# Patient Record
Sex: Male | Born: 1990 | Race: White | Hispanic: No | Marital: Married | Smoking: Never smoker
Health system: Southern US, Community
[De-identification: ages and names within clinical notes are randomized; demographics above are authoritative.]

## PROBLEM LIST (undated history)

## (undated) DIAGNOSIS — Z952 Presence of prosthetic heart valve: Secondary | ICD-10-CM

## (undated) DIAGNOSIS — I639 Cerebral infarction, unspecified: Secondary | ICD-10-CM

## (undated) HISTORY — PX: CARDIAC SURGERY: SHX584

---

## 2012-08-10 DIAGNOSIS — I639 Cerebral infarction, unspecified: Secondary | ICD-10-CM

## 2012-08-10 HISTORY — DX: Cerebral infarction, unspecified: I63.9

## 2017-07-27 ENCOUNTER — Encounter: Payer: Self-pay | Admitting: *Deleted

## 2017-07-27 ENCOUNTER — Emergency Department
Admission: EM | Admit: 2017-07-27 | Discharge: 2017-07-27 | Disposition: A | Discharge status: Home / Self Care | Payer: Managed Care, Other (non HMO) | Attending: Emergency Medicine | Admitting: Emergency Medicine

## 2017-07-27 ENCOUNTER — Other Ambulatory Visit: Payer: Self-pay

## 2017-07-27 ENCOUNTER — Emergency Department: Payer: Managed Care, Other (non HMO)

## 2017-07-27 DIAGNOSIS — M25562 Pain in left knee: Secondary | ICD-10-CM | POA: Insufficient documentation

## 2017-07-27 DIAGNOSIS — Z8673 Personal history of transient ischemic attack (TIA), and cerebral infarction without residual deficits: Secondary | ICD-10-CM | POA: Diagnosis not present

## 2017-07-27 DIAGNOSIS — Z79899 Other long term (current) drug therapy: Secondary | ICD-10-CM | POA: Insufficient documentation

## 2017-07-27 DIAGNOSIS — Y999 Unspecified external cause status: Secondary | ICD-10-CM | POA: Diagnosis not present

## 2017-07-27 DIAGNOSIS — Y9389 Activity, other specified: Secondary | ICD-10-CM | POA: Diagnosis not present

## 2017-07-27 DIAGNOSIS — M79602 Pain in left arm: Secondary | ICD-10-CM | POA: Insufficient documentation

## 2017-07-27 DIAGNOSIS — R51 Headache: Secondary | ICD-10-CM | POA: Diagnosis present

## 2017-07-27 HISTORY — DX: Cerebral infarction, unspecified: I63.9

## 2017-07-27 HISTORY — DX: Presence of prosthetic heart valve: Z95.2

## 2017-07-27 MED ORDER — CYCLOBENZAPRINE HCL 10 MG PO TABS: 10.0000 mg | ORAL_TABLET | Freq: Three times a day (TID) | ORAL | 0 refills | Status: DC | PRN
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
## 2017-07-27 NOTE — Discharge Instructions (Signed)
Advised extra strength Tylenol for headache or body ache.

## 2019-01-02 ENCOUNTER — Emergency Department
Admission: EM | Admit: 2019-01-02 | Discharge: 2019-01-02 | Disposition: A | Discharge status: Home / Self Care | Payer: Managed Care, Other (non HMO) | Attending: Emergency Medicine | Admitting: Emergency Medicine

## 2019-01-02 ENCOUNTER — Encounter: Payer: Self-pay | Admitting: Emergency Medicine

## 2019-01-02 ENCOUNTER — Other Ambulatory Visit: Payer: Self-pay

## 2019-01-02 DIAGNOSIS — Z7982 Long term (current) use of aspirin: Secondary | ICD-10-CM | POA: Insufficient documentation

## 2019-01-02 DIAGNOSIS — Z7901 Long term (current) use of anticoagulants: Secondary | ICD-10-CM | POA: Insufficient documentation

## 2019-01-02 DIAGNOSIS — Z79899 Other long term (current) drug therapy: Secondary | ICD-10-CM | POA: Diagnosis not present

## 2019-01-02 DIAGNOSIS — R319 Hematuria, unspecified: Secondary | ICD-10-CM | POA: Diagnosis not present

## 2019-01-02 LAB — CBC WITH DIFFERENTIAL/PLATELET
Abs Immature Granulocytes: 0.02 10*3/uL (ref 0.00–0.07)
Basophils Absolute: 0.1 10*3/uL (ref 0.0–0.1)
Basophils Relative: 1 %
Eosinophils Absolute: 0.4 10*3/uL (ref 0.0–0.5)
Eosinophils Relative: 6 %
HCT: 43.8 % (ref 39.0–52.0)
Hemoglobin: 14.9 g/dL (ref 13.0–17.0)
Immature Granulocytes: 0 %
Lymphocytes Relative: 20 %
Lymphs Abs: 1.5 10*3/uL (ref 0.7–4.0)
MCH: 30 pg (ref 26.0–34.0)
MCHC: 34 g/dL (ref 30.0–36.0)
MCV: 88.1 fL (ref 80.0–100.0)
Monocytes Absolute: 0.5 10*3/uL (ref 0.1–1.0)
Monocytes Relative: 6 %
Neutro Abs: 5 10*3/uL (ref 1.7–7.7)
Neutrophils Relative %: 67 %
Platelets: 166 10*3/uL (ref 150–400)
RBC: 4.97 MIL/uL (ref 4.22–5.81)
RDW: 12.3 % (ref 11.5–15.5)
WBC: 7.5 10*3/uL (ref 4.0–10.5)
nRBC: 0 % (ref 0.0–0.2)

## 2019-01-02 LAB — URINALYSIS, COMPLETE (UACMP) WITH MICROSCOPIC
Bilirubin Urine: NEGATIVE
Glucose, UA: NEGATIVE mg/dL
Ketones, ur: NEGATIVE mg/dL
Leukocytes,Ua: NEGATIVE
Nitrite: NEGATIVE
Protein, ur: NEGATIVE mg/dL
Specific Gravity, Urine: 1.003 — ABNORMAL LOW (ref 1.005–1.030)
Squamous Epithelial / LPF: NONE SEEN (ref 0–5)
pH: 7 (ref 5.0–8.0)

## 2019-01-02 LAB — BASIC METABOLIC PANEL
Anion gap: 8 (ref 5–15)
BUN: 14 mg/dL (ref 6–20)
CO2: 26 mmol/L (ref 22–32)
Calcium: 9.1 mg/dL (ref 8.9–10.3)
Chloride: 106 mmol/L (ref 98–111)
Creatinine, Ser: 0.7 mg/dL (ref 0.61–1.24)
GFR calc Af Amer: >60 mL/min (ref >60)
GFR calc non Af Amer: >60 mL/min (ref >60)
Glucose, Bld: 106 mg/dL — ABNORMAL HIGH (ref 70–99)
Potassium: 4.3 mmol/L (ref 3.5–5.1)
Sodium: 140 mmol/L (ref 135–145)

## 2019-01-02 LAB — PROTIME-INR
INR: 2.2 — ABNORMAL HIGH (ref 0.8–1.2)
Prothrombin Time: 24 seconds — ABNORMAL HIGH (ref 11.4–15.2)

## 2019-01-02 NOTE — ED Provider Notes (Signed)
[facility] Emergency Department Provider Note ____________________________________________   First MD Initiated Contact with Patient 01/02/19 [phone]     (approximate)  I have reviewed the triage vital signs and the nursing notes.   HISTORY  Chief Complaint Hematuria    HPI Jeff Graham is a 28 y.o. male with PMH as noted below who presents with hematuria, acute onset yesterday, initially more opaque dark blood in the urine and now just pink-tinged urine.  He denies fever, pain, vomiting, or any weakness or lightheadedness.  He has had some bruising which is not unusual for him, but denies any other abnormal bleeding.  He is on Coumadin and states that he has been on the same dose for a while.  He last took it yesterday morning.  Past Medical History:  Diagnosis Date  . Mitral valve replaced   . Stroke [facility]) 2014    There are no active problems to display for this patient.   History reviewed. No pertinent surgical history.  Prior to Admission medications   Medication Sig Start Date End Date Taking? Authorizing Provider  aspirin 81 MG EC tablet Take 81 mg by mouth daily.    Yes [provider]  FLUoxetine (PROZAC) 40 MG capsule Take 40 mg by mouth daily.   Yes [provider]  gabapentin (NEURONTIN) 600 MG tablet Take 600 mg by mouth 3 (three) times daily.   Yes [provider]  metoprolol tartrate (LOPRESSOR) 25 MG tablet Take 25 mg by mouth 2 (two) times a day.   Yes [provider]  warfarin (COUMADIN) 10 MG tablet Take 10 mg by mouth every Monday, Wednesday, and Friday.    Yes [provider]  warfarin (COUMADIN) 5 MG tablet Take 5 mg by mouth See admin instructions. Take 1 tablet (5mg ) by mouth every Sunday, Tuesday, Thursday and Saturday   Yes [provider]    Allergies Patient has no known allergies.  No family history on file.  Social History Social History   Tobacco Use  . Smoking  status: Never Smoker  . Smokeless tobacco: Never Used  Substance Use Topics  . Alcohol use: No    Frequency: Never  . Drug use: Not on file    Review of Systems  Constitutional: No fever. Eyes: No redness. ENT: No sore throat. Cardiovascular: Denies chest pain. Respiratory: Denies shortness of breath. Gastrointestinal: No vomiting or diarrhea.  Genitourinary: Negative for dysuria.  Positive for hematuria. Musculoskeletal: Negative for back pain. Skin: Negative for rash. Neurological: Negative for headache.   ____________________________________________   PHYSICAL EXAM:  VITAL SIGNS: ED Triage Vitals  Enc Vitals Group     BP 01/02/19 0909 116/70     Pulse Rate 01/02/19 0909 72     Resp 01/02/19 0909 16     Temp 01/02/19 0909 98.1 F (36.7 C)     Temp Source 01/02/19 0909 Oral     SpO2 01/02/19 0909 97 %     Weight 01/02/19 0904 175 lb (79.4 kg)     Height 01/02/19 0904 6\' 2"  (1.88 m)     Head Circumference --      Peak Flow --      Pain Score 01/02/19 0904 0     Pain Loc --      Pain Edu? --      Excl. in GC? --     Constitutional: Alert and oriented. Well appearing and in no acute distress. Eyes: Conjunctivae are normal.  No pallor.  Head: Atraumatic. Nose: No congestion/rhinnorhea. Mouth/Throat: Mucous membranes are moist.   Neck: Normal range of motion.  Cardiovascular: Good peripheral circulation. Respiratory: Normal respiratory effort.  Gastrointestinal:  No distention.  Musculoskeletal: No lower extremity edema. Neurologic:  Normal speech and language. No gross focal neurologic deficits are appreciated.  Skin:  Skin is warm and dry. No rash noted. Psychiatric: Mood and affect are normal. Speech and behavior are normal.  ____________________________________________   LABS (all labs ordered are listed, but only abnormal results are displayed)  Labs Reviewed  BASIC METABOLIC PANEL - Abnormal; Notable for the following components:      Result Value    Glucose, Bld 106 (*)    All other components within normal limits  PROTIME-INR - Abnormal; Notable for the following components:   Prothrombin Time 24.0 (*)    INR 2.2 (*)    All other components within normal limits  URINALYSIS, COMPLETE (UACMP) WITH MICROSCOPIC - Abnormal; Notable for the following components:   Color, Urine YELLOW (*)    APPearance CLEAR (*)    Specific Gravity, Urine 1.003 (*)    Hgb urine dipstick LARGE (*)    Bacteria, UA FEW (*)    All other components within normal limits  CBC WITH DIFFERENTIAL/PLATELET   ____________________________________________  EKG   ____________________________________________  RADIOLOGY    ____________________________________________   PROCEDURES  Procedure(s) performed: No  Procedures  Critical Care performed: No ____________________________________________   INITIAL IMPRESSION / ASSESSMENT AND PLAN / ED COURSE  Pertinent labs & imaging results that were available during my care of the patient were reviewed by me and considered in my medical decision making (see chart for details).  28 year old male with PMH as noted above who is on Coumadin after a heart valve replacement presents with hematuria, initially more opaque blood with clots and now the urine is clear but pink in color.  He denies any pain, lightheadedness or weakness, or other abnormal bleeding.  On exam, the patient is well-appearing and his vital signs are normal.  The remainder of the exam is unremarkable.  The main concern is supratherapeutic INR.  The patient has no symptoms to suggest UTI/cystitis, ureteral stone, or other primary urologic etiology of the blood.  We will check basic labs, PT/INR, urinalysis, and reassess.  ----------------------------------------- 10:45 AM on 01/02/2019 -----------------------------------------  Lab work-up is unremarkable.  The patient's INR is actually slightly subtherapeutic.  UA shows RBCs but no other  abnormalities.  The patient has urinated twice while in the ED and it is now clear.  I suspect that he could have had a small kidney stone or some other mild bleeding from the lining of the bladder which has self resolved.  I recommended that he take his dose of Coumadin for today, and continue it as prescribed.  I instructed him to follow-up with his primary care doctor.  If the bleeding becomes recurrent he will need outpatient urology evaluation.  He is stable for discharge home at this time.  I gave the patient the return precautions, and he expressed understanding. ____________________________________________   FINAL CLINICAL IMPRESSION(S) / ED DIAGNOSES  Final diagnoses:  Hematuria, unspecified type      NEW MEDICATIONS STARTED DURING THIS VISIT:  New Prescriptions   No medications on file     Note:  This document was prepared using Dragon voice recognition software and may include unintentional dictation errors.    Dionne BucySiadecki, Chelse Matas, MD 01/02/19 1046

## 2019-01-02 NOTE — Discharge Instructions (Addendum)
Continue to take your Coumadin as prescribed including today's dose.  Return to the ER for new, worsening, or persistent blood in the urine or other abnormal bleeding.  Follow-up with your primary care doctor.

## 2019-01-02 NOTE — ED Triage Notes (Signed)
Pt states has blood in his urine starting last pm. Pt on blood thinners due to a mechanical heart valve. Denies all other sx's.
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
## 2020-01-01 ENCOUNTER — Other Ambulatory Visit: Payer: Self-pay | Admitting: Physician Assistant

## 2020-01-01 DIAGNOSIS — IMO0001 Reserved for inherently not codable concepts without codable children: Secondary | ICD-10-CM

## 2020-01-16 ENCOUNTER — Ambulatory Visit
Admission: RE | Admit: 2020-01-16 | Discharge: 2020-01-16 | Disposition: A | Discharge status: Home / Self Care | Payer: Managed Care, Other (non HMO) | Source: Ambulatory Visit | Attending: Physician Assistant | Admitting: Physician Assistant

## 2020-01-16 ENCOUNTER — Other Ambulatory Visit: Payer: Self-pay

## 2020-01-16 DIAGNOSIS — H9041 Sensorineural hearing loss, unilateral, right ear, with unrestricted hearing on the contralateral side: Secondary | ICD-10-CM | POA: Diagnosis not present

## 2020-01-16 DIAGNOSIS — IMO0001 Reserved for inherently not codable concepts without codable children: Secondary | ICD-10-CM

## 2020-01-16 MED ORDER — GADOBUTROL 1 MMOL/ML IV SOLN
7.0000 mL | Freq: Once | INTRAVENOUS | Status: AC | PRN
  Administered 2020-01-16: 7 mL via INTRAVENOUS

## 2021-04-10 ENCOUNTER — Other Ambulatory Visit: Payer: Self-pay | Admitting: Otolaryngology

## 2021-04-10 DIAGNOSIS — R221 Localized swelling, mass and lump, neck: Secondary | ICD-10-CM

## 2021-04-29 ENCOUNTER — Other Ambulatory Visit: Payer: Self-pay

## 2021-04-29 ENCOUNTER — Ambulatory Visit
Admission: RE | Admit: 2021-04-29 | Discharge: 2021-04-29 | Disposition: A | Discharge status: Home / Self Care | Payer: Managed Care, Other (non HMO) | Source: Ambulatory Visit | Attending: Otolaryngology | Admitting: Otolaryngology

## 2021-04-29 DIAGNOSIS — R221 Localized swelling, mass and lump, neck: Secondary | ICD-10-CM | POA: Diagnosis present

## 2021-04-29 MED ORDER — IOHEXOL 350 MG/ML SOLN
75.0000 mL | Freq: Once | INTRAVENOUS | Status: AC | PRN
  Administered 2021-04-29: 75 mL via INTRAVENOUS

## 2022-07-15 ENCOUNTER — Telehealth: Payer: Managed Care, Other (non HMO) | Admitting: Physician Assistant

## 2022-07-15 DIAGNOSIS — J208 Acute bronchitis due to other specified organisms: Secondary | ICD-10-CM | POA: Diagnosis not present

## 2022-07-15 MED ORDER — PREDNISONE 20 MG PO TABS: 40.0000 mg | ORAL_TABLET | Freq: Every day | ORAL | 0 refills | Status: DC

## 2022-07-15 MED ORDER — ALBUTEROL SULFATE HFA 108 (90 BASE) MCG/ACT IN AERS: 1.0000 | INHALATION_SPRAY | Freq: Four times a day (QID) | RESPIRATORY_TRACT | 0 refills | Status: DC | PRN

## 2022-07-15 MED ORDER — PROMETHAZINE-DM 6.25-15 MG/5ML PO SYRP: 5.0000 mL | ORAL_SOLUTION | Freq: Four times a day (QID) | ORAL | 0 refills | Status: AC | PRN

## 2022-07-15 NOTE — Progress Notes (Signed)
Virtual Visit Consent   Jeff Graham, you are scheduled for a virtual visit with a [facility] provider today. Just as with appointments in the office, your consent must be obtained to participate. Your consent will be active for this visit and any virtual visit you may have with one of our providers in the next 365 days. If you have a MyChart account, a copy of this consent can be sent to you electronically.  As this is a virtual visit, video technology does not allow for your provider to perform a traditional examination. This may limit your provider's ability to fully assess your condition. If your provider identifies any concerns that need to be evaluated in person or the need to arrange testing (such as labs, EKG, etc.), we will make arrangements to do so. Although advances in technology are sophisticated, we cannot ensure that it will always work on either your end or our end. If the connection with a video visit is poor, the visit may have to be switched to a telephone visit. With either a video or telephone visit, we are not always able to ensure that we have a secure connection.  By engaging in this virtual visit, you consent to the provision of healthcare and authorize for your insurance to be billed (if applicable) for the services provided during this visit. Depending on your insurance coverage, you may receive a charge related to this service.  I need to obtain your verbal consent now. Are you willing to proceed with your visit today? Jeff Graham has provided verbal consent on 07/15/2022 for a virtual visit (video or telephone). Jeff Loveless, PA-C  Date: 07/15/2022 9:24 AM  Virtual Visit via Video Note   I, Jeff Graham, connected with  Jeff Graham  ([id number], 03-27-1991) on 07/15/22 at  9:15 AM EST by a video-enabled telemedicine application and verified that I am speaking with the correct person using two identifiers.  Location: Patient: Virtual Visit Location  Patient: Home Provider: Virtual Visit Location Provider: Home Office   I discussed the limitations of evaluation and management by telemedicine and the availability of in person appointments. The patient expressed understanding and agreed to proceed.    History of Present Illness: Jeff Graham is a 31 y.o. who identifies as a male who was assigned male at birth, and is being seen today for Cough.  HPI: Cough This is a new problem. The current episode started in the past 7 days (3-4 days ago). The problem has been gradually worsening. The problem occurs every few minutes. The cough is Productive of sputum and productive of purulent sputum. Associated symptoms include chest pain (burning), chills (on 1st occurence), nasal congestion, a sore throat, shortness of breath (mild DOE) and wheezing. Pertinent negatives include no fever, headaches, myalgias, postnasal drip or rhinorrhea. Associated symptoms comments: Activities increase SOB and coughing. The symptoms are aggravated by lying down and exercise. Treatments tried: tried an inhaler from a friend, tums. The treatment provided mild relief. There is no history of asthma, bronchitis or pneumonia.    Problems: There are no problems to display for this patient.   Allergies: No Known Allergies Medications:  Current Outpatient Medications:    albuterol (VENTOLIN HFA) 108 (90 Base) MCG/ACT inhaler, Inhale 1-2 puffs into the lungs every 6 (six) hours as needed., Disp: 8 g, Rfl: 0   predniSONE (DELTASONE) 20 MG tablet, Take 2 tablets (40 mg total) by mouth daily with breakfast., Disp: 10 tablet, Rfl: 0   promethazine-dextromethorphan (PROMETHAZINE-DM)  6.25-15 MG/5ML syrup, Take 5 mLs by mouth 4 (four) times daily as needed., Disp: 118 mL, Rfl: 0   aspirin 81 MG EC tablet, Take 81 mg by mouth daily. , Disp: , Rfl:    FLUoxetine (PROZAC) 40 MG capsule, Take 40 mg by mouth daily., Disp: , Rfl:    gabapentin (NEURONTIN) 600 MG tablet, Take 600 mg by mouth 3  (three) times daily., Disp: , Rfl:    metoprolol tartrate (LOPRESSOR) 25 MG tablet, Take 25 mg by mouth 2 (two) times a day., Disp: , Rfl:    warfarin (COUMADIN) 10 MG tablet, Take 10 mg by mouth every Monday, Wednesday, and Friday. , Disp: , Rfl:    warfarin (COUMADIN) 5 MG tablet, Take 5 mg by mouth See admin instructions. Take 1 tablet (5mg ) by mouth every Sunday, Tuesday, Thursday and Saturday, Disp: , Rfl:   Observations/Objective: Patient is well-developed, well-nourished in no acute distress.  Resting comfortably at home.  Head is normocephalic, atraumatic.  No labored breathing.  Speech is clear and coherent with logical content.  Patient is alert and oriented at baseline.    Assessment and Plan: 1. Viral bronchitis - predniSONE (DELTASONE) 20 MG tablet; Take 2 tablets (40 mg total) by mouth daily with breakfast.  Dispense: 10 tablet; Refill: 0 - albuterol (VENTOLIN HFA) 108 (90 Base) MCG/ACT inhaler; Inhale 1-2 puffs into the lungs every 6 (six) hours as needed.  Dispense: 8 g; Refill: 0 - promethazine-dextromethorphan (PROMETHAZINE-DM) 6.25-15 MG/5ML syrup; Take 5 mLs by mouth 4 (four) times daily as needed.  Dispense: 118 mL; Refill: 0  - Suspect viral bronchitis - Prednisone, albuterol and Promethazine DM prescribed - Symptomatic medications of choice over the counter as needed - Push fluids - Rest - Seek further evaluation if symptoms change or worsen   Follow Up Instructions: I discussed the assessment and treatment plan with the patient. The patient was provided an opportunity to ask questions and all were answered. The patient agreed with the plan and demonstrated an understanding of the instructions.  A copy of instructions were sent to the patient via MyChart unless otherwise noted below.    The patient was advised to call back or seek an in-person evaluation if the symptoms worsen or if the condition fails to improve as anticipated.  Time:  I spent 12 minutes  with the patient via telehealth technology discussing the above problems/concerns.    10-[birth year]-1990, PA-C

## 2022-07-15 NOTE — Patient Instructions (Signed)
Estill Bakes, thank you for joining Margaretann Loveless, PA-C for today's virtual visit.  While this provider is not your primary care provider (PCP), if your PCP is located in our provider database this encounter information will be shared with them immediately following your visit.   A [facility] MyChart account gives you access to today's visit and all your visits, tests, and labs performed at [facility] " click here if you don't have a [facility] MyChart account or go to [email or web address]  Consent: (Patient) Jeff Graham provided verbal consent for this virtual visit at the beginning of the encounter.  Current Medications:  Current Outpatient Medications:    albuterol (VENTOLIN HFA) 108 (90 Base) MCG/ACT inhaler, Inhale 1-2 puffs into the lungs every 6 (six) hours as needed., Disp: 8 g, Rfl: 0   predniSONE (DELTASONE) 20 MG tablet, Take 2 tablets (40 mg total) by mouth daily with breakfast., Disp: 10 tablet, Rfl: 0   promethazine-dextromethorphan (PROMETHAZINE-DM) 6.25-15 MG/5ML syrup, Take 5 mLs by mouth 4 (four) times daily as needed., Disp: 118 mL, Rfl: 0   aspirin 81 MG EC tablet, Take 81 mg by mouth daily. , Disp: , Rfl:    FLUoxetine (PROZAC) 40 MG capsule, Take 40 mg by mouth daily., Disp: , Rfl:    gabapentin (NEURONTIN) 600 MG tablet, Take 600 mg by mouth 3 (three) times daily., Disp: , Rfl:    metoprolol tartrate (LOPRESSOR) 25 MG tablet, Take 25 mg by mouth 2 (two) times a day., Disp: , Rfl:    warfarin (COUMADIN) 10 MG tablet, Take 10 mg by mouth every Monday, Wednesday, and Friday. , Disp: , Rfl:    warfarin (COUMADIN) 5 MG tablet, Take 5 mg by mouth See admin instructions. Take 1 tablet (5mg ) by mouth every Sunday, Tuesday, Thursday and Saturday, Disp: , Rfl:    Medications ordered in this encounter:  Meds ordered this encounter  Medications   predniSONE (DELTASONE) 20 MG tablet    Sig: Take 2 tablets (40 mg total) by mouth daily with breakfast.     Dispense:  10 tablet    Refill:  0    Order Specific Question:   Supervising Provider    Answer:   Wednesday Merrilee Jansky   albuterol (VENTOLIN HFA) 108 (90 Base) MCG/ACT inhaler    Sig: Inhale 1-2 puffs into the lungs every 6 (six) hours as needed.    Dispense:  8 g    Refill:  0    Order Specific Question:   Supervising Provider    Answer:   [[id number]] Merrilee Jansky   promethazine-dextromethorphan (PROMETHAZINE-DM) 6.25-15 MG/5ML syrup    Sig: Take 5 mLs by mouth 4 (four) times daily as needed.    Dispense:  118 mL    Refill:  0    Order Specific Question:   Supervising Provider    Answer:   10-12-1991 Merrilee Jansky     *If you need refills on other medications prior to your next appointment, please contact your pharmacy*  Follow-Up: Call back or seek an in-person evaluation if the symptoms worsen or if the condition fails to improve as anticipated.  [facility] [phone]  Other Instructions  Acute Bronchitis, Adult  Acute bronchitis is sudden inflammation of the main airways (bronchi) that come off the windpipe (trachea) in the lungs. The swelling causes the airways to get smaller and make more mucus than normal. This can make it hard to breathe and can cause coughing  or noisy breathing (wheezing). Acute bronchitis may last several weeks. The cough may last longer. Allergies, asthma, and exposure to smoke may make the condition worse. What are the causes? This condition can be caused by germs and by substances that irritate the lungs, including: Cold and flu viruses. The most common cause of this condition is the virus that causes the common cold. Bacteria. This is less common. Breathing in substances that irritate the lungs, including: Smoke from cigarettes and other forms of tobacco. Dust and pollen. Fumes from household cleaning products, gases, or burned fuel. Indoor or outdoor air pollution. What increases the risk? The following factors  may make you more likely to develop this condition: A weak body's defense system, also called the immune system. A condition that affects your lungs and breathing, such as asthma. What are the signs or symptoms? Common symptoms of this condition include: Coughing. This may bring up clear, yellow, or green mucus from your lungs (sputum). Wheezing. Runny or stuffy nose. Having too much mucus in your lungs (chest congestion). Shortness of breath. Aches and pains, including sore throat or chest. How is this diagnosed? This condition is usually diagnosed based on: Your symptoms and medical history. A physical exam. You may also have other tests, including tests to rule out other conditions, such as pneumonia. These tests include: A test of lung function. Test of a mucus sample to look for the presence of bacteria. Tests to check the oxygen level in your blood. Blood tests. Chest X-ray. How is this treated? Most cases of acute bronchitis clear up over time without treatment. Your health care provider may recommend: Drinking more fluids to help thin your mucus so it is easier to cough up. Taking inhaled medicine (inhaler) to improve air flow in and out of your lungs. Using a vaporizer or a humidifier. These are machines that add water to the air to help you breathe better. Taking a medicine that thins mucus and clears congestion (expectorant). Taking a medicine that prevents or stops coughing (cough suppressant). It is not common to take an antibiotic medicine for this condition. Follow these instructions at home:  Take over-the-counter and prescription medicines only as told by your health care provider. Use an inhaler, vaporizer, or humidifier as told by your health care provider. Take two teaspoons (10 mL) of honey at bedtime to lessen coughing at night. Drink enough fluid to keep your urine pale yellow. Do not use any products that contain nicotine or tobacco. These products include  cigarettes, chewing tobacco, and vaping devices, such as e-cigarettes. If you need help quitting, ask your health care provider. Get plenty of rest. Return to your normal activities as told by your health care provider. Ask your health care provider what activities are safe for you. Keep all follow-up visits. This is important. How is this prevented? To lower your risk of getting this condition again: Wash your hands often with soap and water for at least 20 seconds. If soap and water are not available, use hand sanitizer. Avoid contact with people who have cold symptoms. Try not to touch your mouth, nose, or eyes with your hands. Avoid breathing in smoke or chemical fumes. Breathing smoke or chemical fumes will make your condition worse. Get the flu shot every year. Contact a health care provider if: Your symptoms do not improve after 2 weeks. You have trouble coughing up the mucus. Your cough keeps you awake at night. You have a fever. Get help right away if you:  Cough up blood. Feel pain in your chest. Have severe shortness of breath. Faint or keep feeling like you are going to faint. Have a severe headache. Have a fever or chills that get worse. These symptoms may represent a serious problem that is an emergency. Do not wait to see if the symptoms will go away. Get medical help right away. Call your local emergency services (911 in the [place] Do not drive yourself to the hospital. Summary Acute bronchitis is inflammation of the main airways (bronchi) that come off the windpipe (trachea) in the lungs. The swelling causes the airways to get smaller and make more mucus than normal. Drinking more fluids can help thin your mucus so it is easier to cough up. Take over-the-counter and prescription medicines only as told by your health care provider. Do not use any products that contain nicotine or tobacco. These products include cigarettes, chewing tobacco, and vaping devices, such as  e-cigarettes. If you need help quitting, ask your health care provider. Contact a health care provider if your symptoms do not improve after 2 weeks. This information is not intended to replace advice given to you by your health care provider. Make sure you discuss any questions you have with your health care provider. Document Revised: 11/06/2021 Document Reviewed: 11/27/2020 Elsevier Patient Education  2023 Elsevier Inc.    If you have been instructed to have an in-person evaluation today at a local Urgent Care facility, please use the link below. It will take you to a list of all of our available [facility], including address, phone number and hours of operation. Please do not delay care.  [facility]  If you or a family member do not have a primary care provider, use the link below to schedule a visit and establish care. When you choose a [facility] primary care physician or advanced practice provider, you gain a long-term partner in health. Find a Primary Care Provider  Learn more about [facility]'s in-office and virtual care options: [facility] - Get Care Now

## 2023-05-10 IMAGING — CT CT NECK W/ CM
5 series · 16 of 33 positions shown, 18 images · IV contrast (omnipaque)
Comparison: None.

CLINICAL DATA: Right submandibular swelling

EXAM:
CT NECK WITH CONTRAST
TECHNIQUE: Multidetector CT imaging of the neck was performed using the
standard protocol following the bolus administration of intravenous
contrast.
CONTRAST:  75mL OMNIPAQUE IOHEXOL 350 MG/ML SOLN

[Series 2: axial neck neck (person_name) 2.00 · axial · 0.57mm/px · z∈[-687,-589]mm · 2 of 147 slices shown]
[im 49/147  bone]
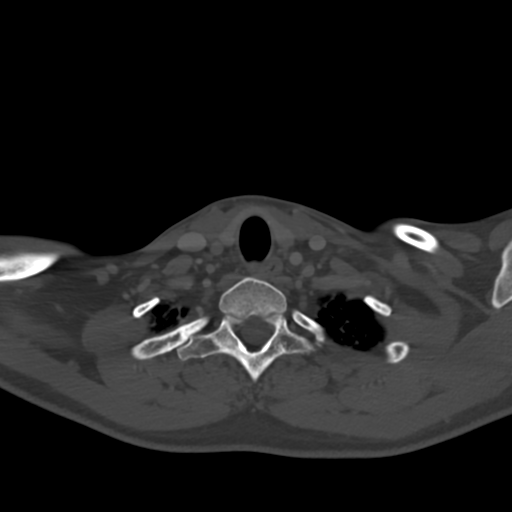
[im 98/147  bone]
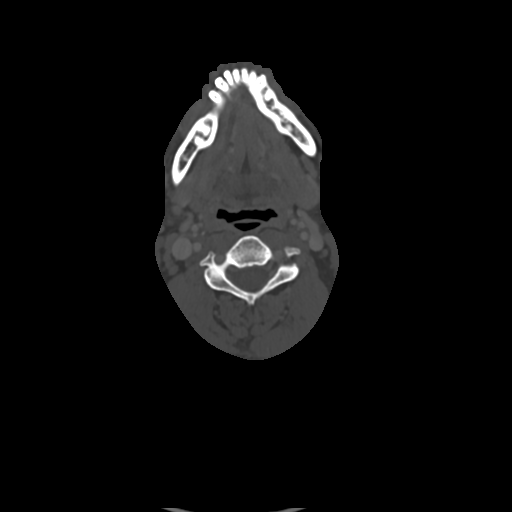

[Series 3: axial bone neck 2.00 · axial · 0.57mm/px · z∈[-711,-565]mm · 3 of 147 slices shown]
[im 37/147  bone]
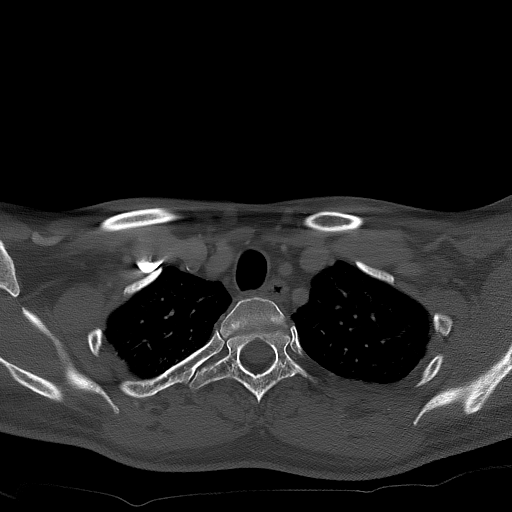
[im 74/147  bone]
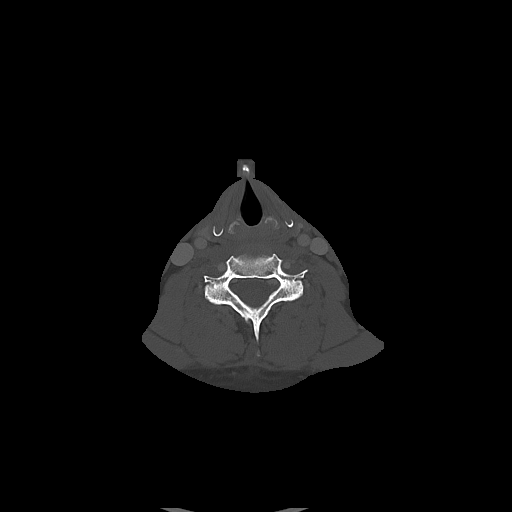
[im 110/147  bone]
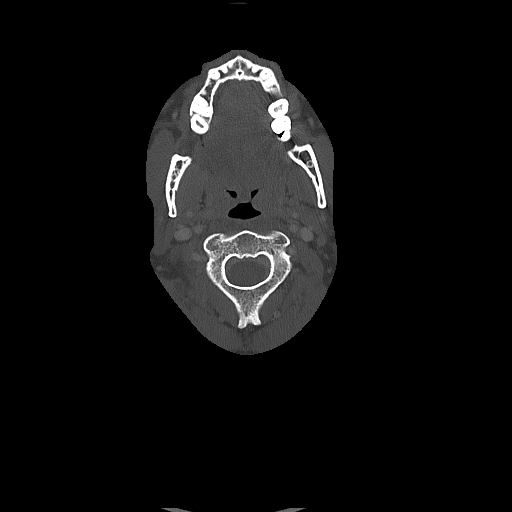

[Series 4: coronal neck neck (person_name) 2.00 cor · coronal · 0.57mm/px · 3 of 136 slices shown]
[im 28/136  bone]
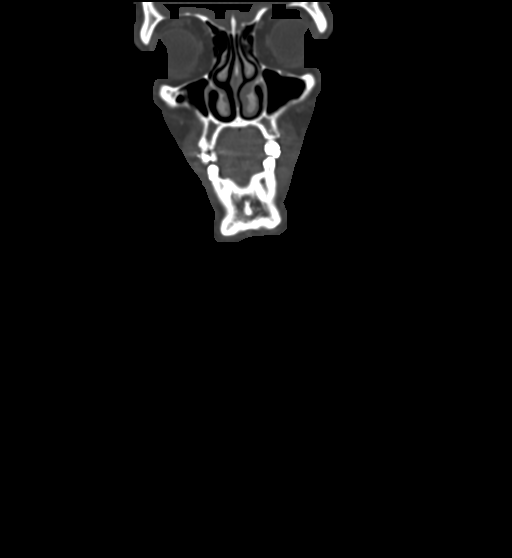
[im 55/136  bone]
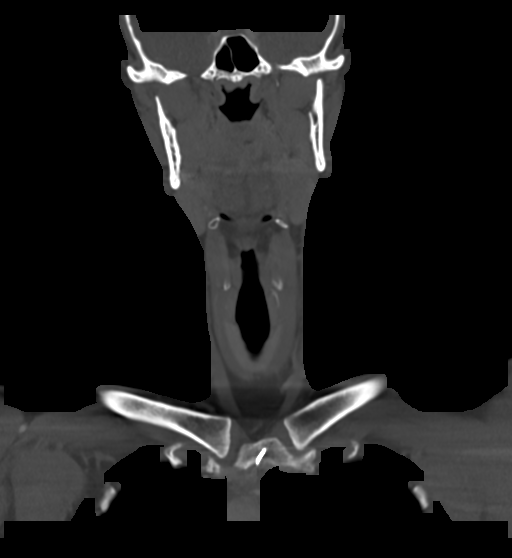
[im 82/136  bone]
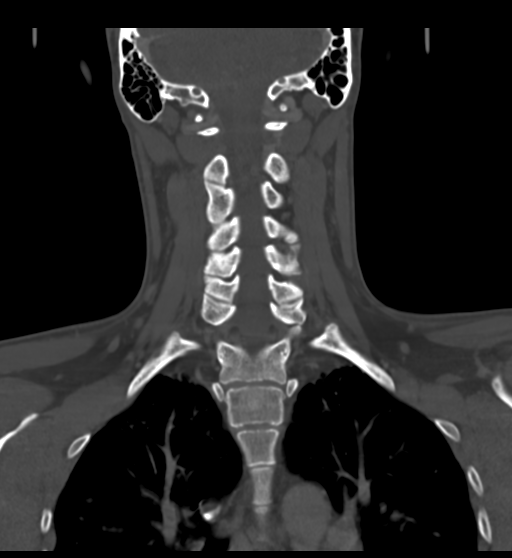

[Series 6: sagittal neck neck (person_name) 2.00 sag · sagittal · 0.53mm/px · 5 of 146 slices shown, 6 images]
[im 49/146  bone]
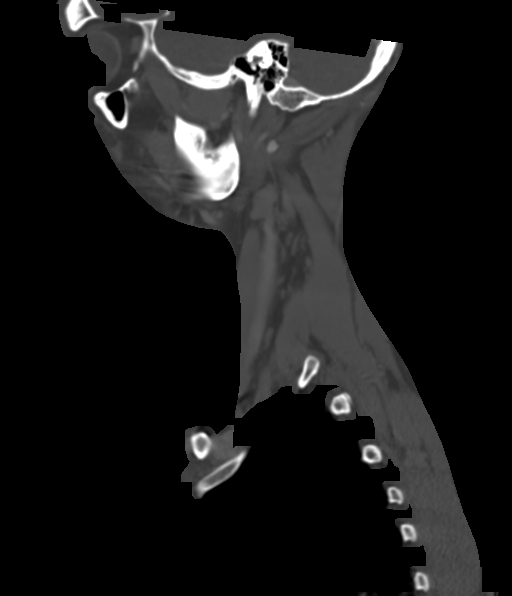
[im 61/146  bone]
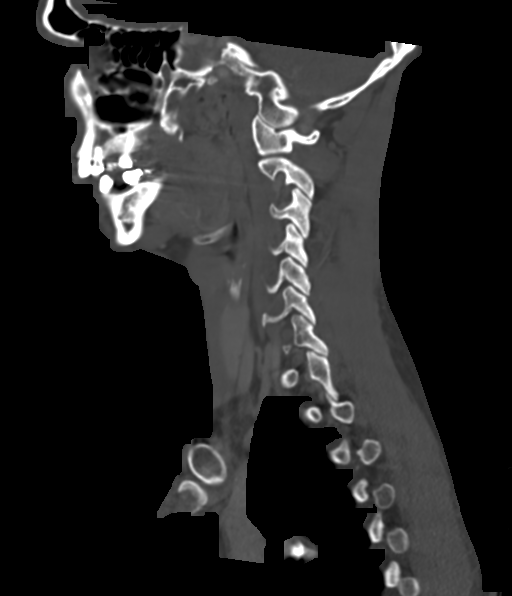
[im 73/146  soft-tissue]
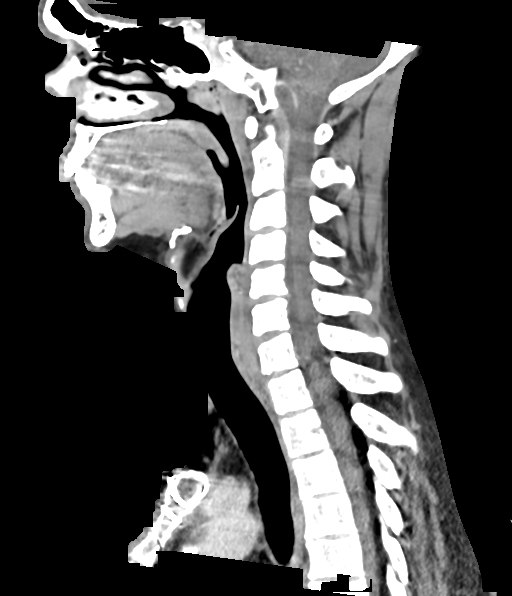
[im 73/146  bone]
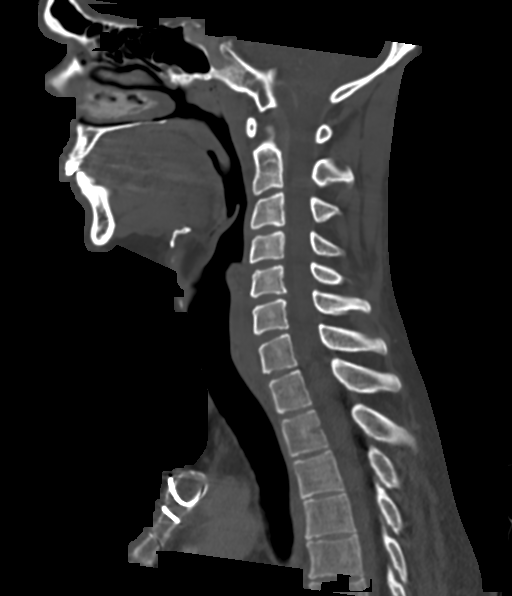
[im 85/146  bone]
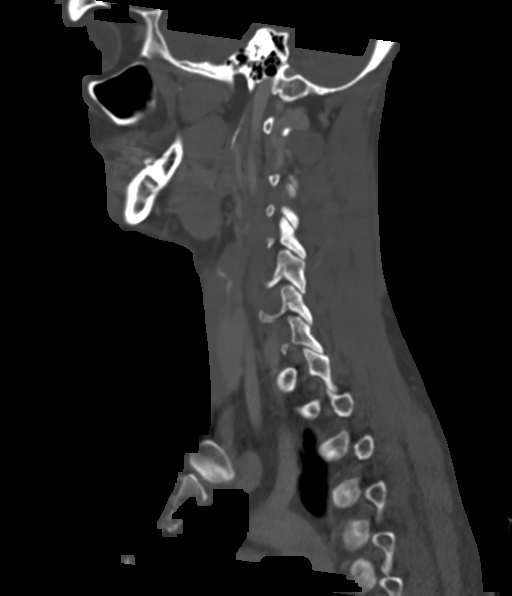
[im 97/146  bone]
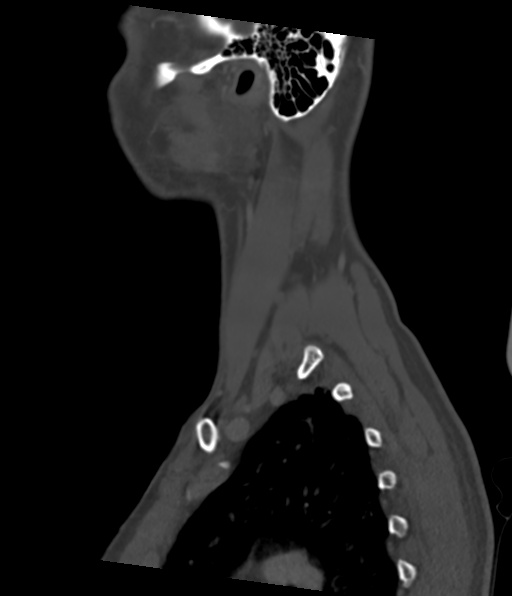

[Series 8: ax oropharynx neck neck (person_name) 2.00 ax · axial · 0.53mm/px · z∈[-733,-576]mm · 3 of 159 slices shown, 4 images]
[im 40/159  soft-tissue]
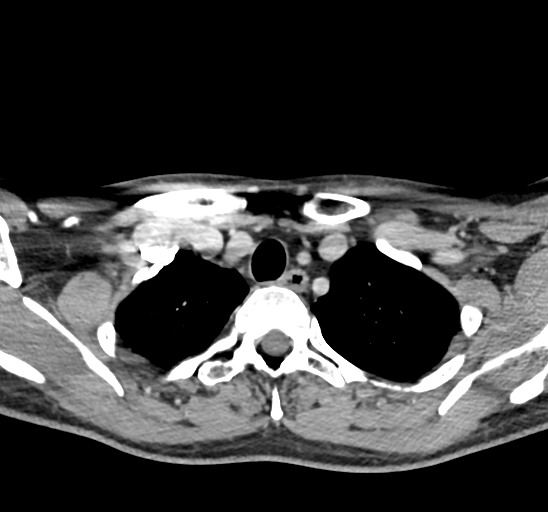
[im 40/159  bone]
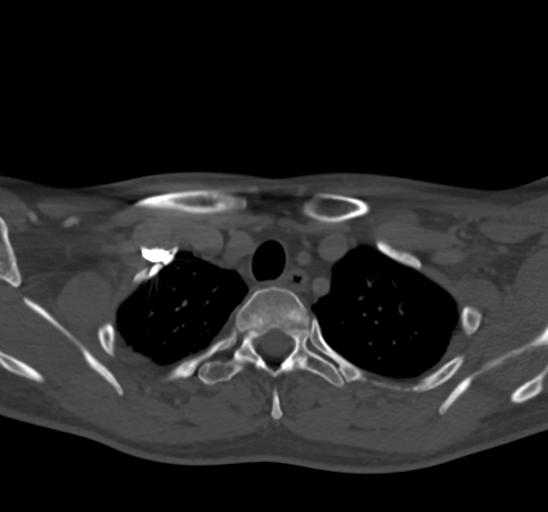
[im 80/159  bone]
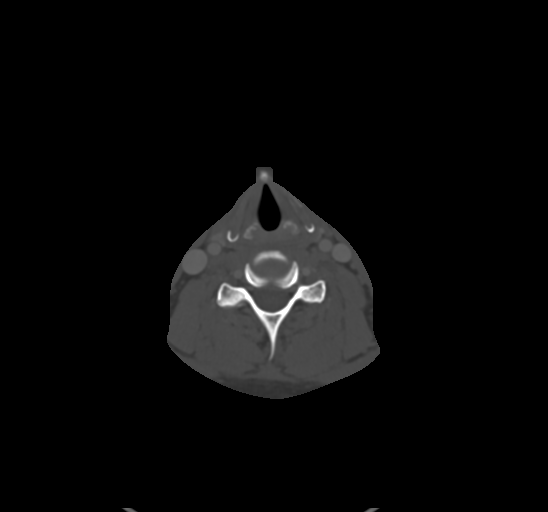
[im 119/159  bone]
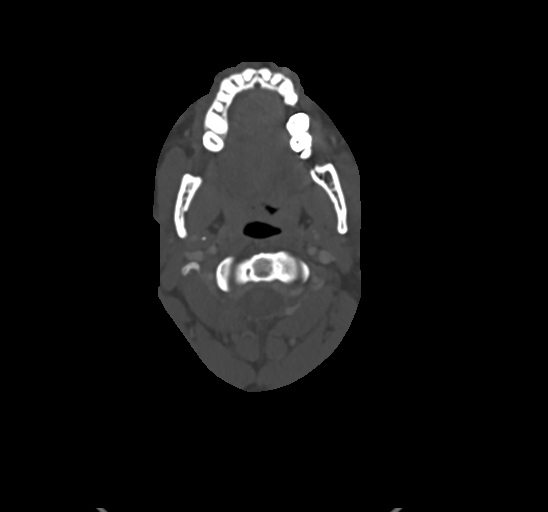

[16 of 33 positions shown; findings below may reference images not displayed]

FINDINGS: Pharynx and larynx: Normal. No mass or swelling.

Salivary glands: No inflammation, mass, or stone. The right
submandibular gland extends minimally more inferiorly compared to
the left submandibular gland, but they are roughly symmetric in
size.

Thyroid: Normal.

Lymph nodes: None enlarged or abnormal density.

Vascular: Negative.

Limited intracranial: Negative.

Visualized orbits: Negative.

Mastoids and visualized paranasal sinuses: Clear.

Skeleton: No acute or aggressive process.

Upper chest: Negative.

Other: None.
IMPRESSION: No acute process in the neck. The right submandibular gland is
minimally low lying compared to the left, but they are roughly
symmetric in size and within normal limits.

## 2023-08-31 ENCOUNTER — Ambulatory Visit
Admission: RE | Admit: 2023-08-31 | Discharge: 2023-08-31 | Disposition: A | Discharge status: Home / Self Care | Payer: Managed Care, Other (non HMO) | Source: Ambulatory Visit | Attending: Emergency Medicine | Admitting: Emergency Medicine

## 2023-08-31 VITALS — BP 122/80 | HR 76 | Temp 97.7°F | Resp 18

## 2023-08-31 DIAGNOSIS — J069 Acute upper respiratory infection, unspecified: Secondary | ICD-10-CM | POA: Diagnosis not present

## 2023-08-31 MED ORDER — PREDNISONE 10 MG PO TABS: 40.0000 mg | ORAL_TABLET | Freq: Every day | ORAL | 0 refills | Status: AC

## 2023-08-31 MED ORDER — ALBUTEROL SULFATE HFA 108 (90 BASE) MCG/ACT IN AERS: 1.0000 | INHALATION_SPRAY | Freq: Four times a day (QID) | RESPIRATORY_TRACT | 0 refills | Status: AC | PRN

## 2023-08-31 MED ORDER — AMOXICILLIN-POT CLAVULANATE 875-125 MG PO TABS: 1.0000 | ORAL_TABLET | Freq: Two times a day (BID) | ORAL | 0 refills | Status: AC

## 2023-08-31 NOTE — ED Provider Notes (Signed)
Jeff Graham    CSN: [id number] Arrival date & time: 08/31/23  1725      History   Chief Complaint Chief Complaint  Patient presents with   Cough    I have a deep cough. I also have a cyst in my wrist I was hoping to get drained. - Entered by patient    HPI Jeff Graham is a 33 y.o. male.  Patient presents with congestion, runny nose, cough x 3 to 4 days.  No fever or shortness of breath.  Treating symptoms with DayQuil and NyQuil.  Patient's medical history includes mitral valve replacement, warfarin anticoagulation, aneurysm repair, cytotoxic cerebral edema, splenic infarct, nontraumatic cortical hemorrhage of cerebral hemisphere, vegetative endocarditis of mitral valve.  The history is provided by the patient and medical records.    Past Medical History:  Diagnosis Date   Mitral valve replaced    Stroke [facility]) 2014    There are no active problems to display for this patient.   Past Surgical History:  Procedure Laterality Date   CARDIAC SURGERY         Home Medications    Prior to Admission medications   Medication Sig Start Date End Date Taking? Authorizing Provider  albuterol (VENTOLIN HFA) 108 (90 Base) MCG/ACT inhaler Inhale 1-2 puffs into the lungs every 6 (six) hours as needed. 08/31/23  Yes Mickie Bail, NP  amoxicillin-clavulanate (AUGMENTIN) 875-125 MG tablet Take 1 tablet by mouth every 12 (twelve) hours. 08/31/23  Yes Mickie Bail, NP  predniSONE (DELTASONE) 10 MG tablet Take 4 tablets (40 mg total) by mouth daily for 5 days. 08/31/23 09/05/23 Yes Mickie Bail, NP  aspirin 81 MG EC tablet Take 81 mg by mouth daily.     [provider]  FLUoxetine (PROZAC) 40 MG capsule Take 40 mg by mouth daily.    [provider]  gabapentin (NEURONTIN) 600 MG tablet Take 600 mg by mouth 3 (three) times daily.    [provider]  metoprolol tartrate (LOPRESSOR) 25 MG tablet Take 25 mg by mouth 2 (two) times a day.    [provider]  promethazine-dextromethorphan (PROMETHAZINE-DM) 6.25-15 MG/5ML syrup Take 5 mLs by mouth 4 (four) times daily as needed. Patient not taking: Reported on 08/31/2023 07/15/22   Margaretann Loveless, PA-C  warfarin (COUMADIN) 10 MG tablet Take 10 mg by mouth every Monday, Wednesday, and Friday.     [provider]  warfarin (COUMADIN) 5 MG tablet Take 5 mg by mouth See admin instructions. Take 1 tablet (5mg ) by mouth every Sunday, Tuesday, Thursday and Saturday    [provider]    Family History History reviewed. No pertinent family history.  Social History Social History   Tobacco Use   Smoking status: Never   Smokeless tobacco: Never  Substance Use Topics   Alcohol use: No     Allergies   Patient has no known allergies.   Review of Systems Review of Systems  Constitutional:  Negative for chills and fever.  HENT:  Positive for congestion. Negative for ear pain and sore throat.   Respiratory:  Positive for cough. Negative for shortness of breath.      Physical Exam Triage Vital Signs ED Triage Vitals  Encounter Vitals Group     BP 08/31/23 1743 122/80     Systolic BP Percentile --      Diastolic BP Percentile --      Pulse Rate 08/31/23 1733 76  Resp 08/31/23 1733 18     Temp 08/31/23 1733 97.7 F (36.5 C)     Temp src --      SpO2 08/31/23 1733 97 %     Weight --      Height --      Head Circumference --      Peak Flow --      Pain Score 08/31/23 1738 2     Pain Loc --      Pain Education --      Exclude from Growth Chart --    No data found.  Updated Vital Signs BP 122/80   Pulse 76   Temp 97.7 F (36.5 C)   Resp 18   SpO2 97%   Visual Acuity Right Eye Distance:   Left Eye Distance:   Bilateral Distance:    Right Eye Near:   Left Eye Near:    Bilateral Near:     Physical Exam Constitutional:      General: He is not in acute distress. HENT:     Right Ear: Tympanic membrane normal.     Left Ear: Tympanic  membrane normal.     Nose: Congestion and rhinorrhea present.     Mouth/Throat:     Mouth: Mucous membranes are moist.     Pharynx: Oropharynx is clear.  Cardiovascular:     Rate and Rhythm: Normal rate and regular rhythm.     Heart sounds: Normal heart sounds.  Pulmonary:     Effort: Pulmonary effort is normal. No respiratory distress.     Breath sounds: Wheezing present.     Comments: Scattered bilateral wheezes.  No respiratory distress.  O2 sat 97% on room air. Neurological:     Mental Status: He is alert.      UC Treatments / Results  Labs (all labs ordered are listed, but only abnormal results are displayed) Labs Reviewed - No data to display  EKG   Radiology No results found.  Procedures Procedures (including critical care time)  Medications Ordered in UC Medications - No data to display  Initial Impression / Assessment and Plan / UC Course  I have reviewed the triage vital signs and the nursing notes.  Pertinent labs & imaging results that were available during my care of the patient were reviewed by me and considered in my medical decision making (see chart for details).    Acute upper respiratory infection.  Patient declines chest x-ray.  Treating today with albuterol inhaler and prednisone.  Prescription for Augmentin also sent to his pharmacy to start if his symptoms are not improving.  He cannot take Zithromax as he is on warfarin.  Instructed him to follow-up with his PCP.  ED precautions given.  Education provided on upper respiratory infection.  He agrees to plan of care.  Final Clinical Impressions(s) / UC Diagnoses   Final diagnoses:  Acute upper respiratory infection     Discharge Instructions      Take the prednisone and Augmentin as directed.  Use the albuterol inhaler as directed.  Follow up with your primary care provider tomorrow.  Go to the emergency department if you have worsening symptoms.        ED Prescriptions     Medication  Sig Dispense Auth. Provider   albuterol (VENTOLIN HFA) 108 (90 Base) MCG/ACT inhaler Inhale 1-2 puffs into the lungs every 6 (six) hours as needed. 18 g Mickie Bail, NP   predniSONE (DELTASONE) 10 MG tablet Take 4  tablets (40 mg total) by mouth daily for 5 days. 20 tablet Mickie Bail, NP   amoxicillin-clavulanate (AUGMENTIN) 875-125 MG tablet Take 1 tablet by mouth every 12 (twelve) hours. 14 tablet Mickie Bail, NP      PDMP not reviewed this encounter.   Mickie Bail, NP 08/31/23 [phone]

## 2023-08-31 NOTE — ED Triage Notes (Signed)
Patient to Urgent Care with complaints of productive cough/ nasal and chest congestion that started three days ago. Denies any known fevers.  Taking dayquil/ nyquil/ halls.  Hx of bronchitis- reports concerns about the same.

## 2023-08-31 NOTE — Discharge Instructions (Addendum)
Take the prednisone and Augmentin as directed.  Use the albuterol inhaler as directed.  Follow up with your primary care provider tomorrow.  Go to the emergency department if you have worsening symptoms.

## 2024-01-06 ENCOUNTER — Emergency Department

## 2024-01-06 ENCOUNTER — Emergency Department
Admission: EM | Admit: 2024-01-06 | Discharge: 2024-01-06 | Disposition: A | Discharge status: Home / Self Care | Attending: Emergency Medicine | Admitting: Emergency Medicine

## 2024-01-06 ENCOUNTER — Other Ambulatory Visit: Payer: Self-pay

## 2024-01-06 ENCOUNTER — Encounter: Payer: Self-pay | Admitting: Emergency Medicine

## 2024-01-06 DIAGNOSIS — Z7901 Long term (current) use of anticoagulants: Secondary | ICD-10-CM | POA: Insufficient documentation

## 2024-01-06 DIAGNOSIS — R079 Chest pain, unspecified: Secondary | ICD-10-CM

## 2024-01-06 DIAGNOSIS — R0789 Other chest pain: Secondary | ICD-10-CM | POA: Insufficient documentation

## 2024-01-06 DIAGNOSIS — Z79899 Other long term (current) drug therapy: Secondary | ICD-10-CM | POA: Diagnosis not present

## 2024-01-06 DIAGNOSIS — U071 COVID-19: Secondary | ICD-10-CM | POA: Insufficient documentation

## 2024-01-06 DIAGNOSIS — R000 Tachycardia, unspecified: Secondary | ICD-10-CM | POA: Insufficient documentation

## 2024-01-06 DIAGNOSIS — R791 Abnormal coagulation profile: Secondary | ICD-10-CM | POA: Insufficient documentation

## 2024-01-06 LAB — BASIC METABOLIC PANEL WITH GFR
Anion gap: 8 (ref 5–15)
BUN: 11 mg/dL (ref 6–20)
CO2: 23 mmol/L (ref 22–32)
Calcium: 8.9 mg/dL (ref 8.9–10.3)
Chloride: 108 mmol/L (ref 98–111)
Creatinine, Ser: 0.82 mg/dL (ref 0.61–1.24)
GFR, Estimated: >60 mL/min (ref >60)
Glucose, Bld: 127 mg/dL — ABNORMAL HIGH (ref 70–99)
Potassium: 3.1 mmol/L — ABNORMAL LOW (ref 3.5–5.1)
Sodium: 139 mmol/L (ref 135–145)

## 2024-01-06 LAB — PROCALCITONIN: Procalcitonin: <0.1 ng/mL

## 2024-01-06 LAB — HEPATIC FUNCTION PANEL
ALT: 18 U/L (ref 0–44)
AST: 23 U/L (ref 15–41)
Albumin: 4.1 g/dL (ref 3.5–5.0)
Alkaline Phosphatase: 45 U/L (ref 38–126)
Bilirubin, Direct: 0.1 mg/dL (ref 0.0–0.2)
Indirect Bilirubin: 0.8 mg/dL (ref 0.3–0.9)
Total Bilirubin: 0.9 mg/dL (ref 0.0–1.2)
Total Protein: 6.4 g/dL — ABNORMAL LOW (ref 6.5–8.1)

## 2024-01-06 LAB — RESP PANEL BY RT-PCR (RSV, FLU A&B, COVID)  RVPGX2
Influenza A by PCR: NEGATIVE
Influenza B by PCR: NEGATIVE
Resp Syncytial Virus by PCR: NEGATIVE
SARS Coronavirus 2 by RT PCR: POSITIVE — AB

## 2024-01-06 LAB — CBC
HCT: 38.1 % — ABNORMAL LOW (ref 39.0–52.0)
Hemoglobin: 13.5 g/dL (ref 13.0–17.0)
MCH: 31.1 pg (ref 26.0–34.0)
MCHC: 35.4 g/dL (ref 30.0–36.0)
MCV: 87.8 fL (ref 80.0–100.0)
Platelets: 162 10*3/uL (ref 150–400)
RBC: 4.34 MIL/uL (ref 4.22–5.81)
RDW: 12.1 % (ref 11.5–15.5)
WBC: 6.3 10*3/uL (ref 4.0–10.5)
nRBC: 0 % (ref 0.0–0.2)

## 2024-01-06 LAB — MAGNESIUM: Magnesium: 1.7 mg/dL (ref 1.7–2.4)

## 2024-01-06 LAB — PROTIME-INR
INR: 1.1 (ref 0.8–1.2)
Prothrombin Time: 14.5 s (ref 11.4–15.2)

## 2024-01-06 LAB — TROPONIN I (HIGH SENSITIVITY)
Troponin I (High Sensitivity): <2 ng/L (ref <18)
Troponin I (High Sensitivity): 3 ng/L (ref <18)

## 2024-01-06 LAB — TSH: TSH: 0.79 u[IU]/mL (ref 0.350–4.500)

## 2024-01-06 LAB — LACTIC ACID, PLASMA: Lactic Acid, Venous: 1.8 mmol/L (ref 0.5–1.9)

## 2024-01-06 MED ORDER — POTASSIUM CHLORIDE CRYS ER 20 MEQ PO TBCR
40.0000 meq | EXTENDED_RELEASE_TABLET | Freq: Once | ORAL | Status: AC
  Administered 2024-01-06: 40 meq via ORAL
  Filled 2024-01-06: qty 2

## 2024-01-06 MED ORDER — IBUPROFEN 800 MG PO TABS
800.0000 mg | ORAL_TABLET | Freq: Once | ORAL | Status: AC
  Administered 2024-01-06: 800 mg via ORAL
  Filled 2024-01-06: qty 1

## 2024-01-06 MED ORDER — NIRMATRELVIR/RITONAVIR (PAXLOVID)TABLET: 3.0000 | ORAL_TABLET | Freq: Two times a day (BID) | ORAL | 0 refills | Status: AC

## 2024-01-06 MED ORDER — IOHEXOL 350 MG/ML SOLN
75.0000 mL | Freq: Once | INTRAVENOUS | Status: AC | PRN
  Administered 2024-01-06: 75 mL via INTRAVENOUS

## 2024-01-06 MED ORDER — SODIUM CHLORIDE 0.9 % IV BOLUS (SEPSIS)
500.0000 mL | Freq: Once | INTRAVENOUS | Status: AC
  Administered 2024-01-06: 500 mL via INTRAVENOUS

## 2024-01-06 NOTE — Discharge Instructions (Addendum)
 You may alternate Tylenol 1000 mg every 6 hours as needed for pain, fever and Ibuprofen  800 mg every 6-8 hours as needed for pain, fever.  Please take Ibuprofen  with food.  Do not take more than 4000 mg of Tylenol (acetaminophen) in a 24 hour period.   Your CT scan did show 1 enlarged lymph node which is likely reactive secondary to your COVID-19 infection but we do recommend follow-up with your outpatient doctors to ensure this resolves.  Your INR level was subtherapeutic at 1.1.  Please take your Coumadin as prescribed and follow-up closely with your cardiologist.

## 2024-01-06 NOTE — ED Provider Notes (Addendum)
 [facility] Provider Note    Event Date/Time   First MD Initiated Contact with Patient 01/06/24 [phone]     (approximate)   History   Chest Pain and Fever   HPI  Michae Graham is a 33 y.o. 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  Reports subjective fevers at home.  No cough, shortness of breath, vomiting, diarrhea, urinary symptoms.  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  He denies any history of relapse and denies any prior history of IV drug use.  Patient reports he took Tylenol PM prior to arrival.   History provided by patient, wife.    Past Medical History:  Diagnosis Date   Mitral valve replaced    Stroke [facility]) 2014    Past Surgical History:  Procedure Laterality Date   CARDIAC SURGERY      MEDICATIONS:  Prior to Admission medications   Medication Sig Start Date End Date Taking? Authorizing Provider  albuterol  (VENTOLIN  HFA) 108 (90 Base) MCG/ACT inhaler Inhale 1-2 puffs into the lungs every 6 (six) hours as needed. 08/31/23   Wellington Half, NP  amoxicillin -clavulanate (AUGMENTIN ) 875-125 MG tablet Take 1 tablet by mouth every 12 (twelve) hours. 08/31/23   Wellington Half, NP  aspirin 81 MG EC tablet Take 81 mg by mouth daily.     [provider]  FLUoxetine (PROZAC) 40 MG capsule Take 40 mg by mouth daily.    [provider]  gabapentin (NEURONTIN) 600 MG tablet Take 600 mg by mouth 3 (three) times daily.    [provider]  metoprolol tartrate (LOPRESSOR) 25 MG tablet Take 25 mg by mouth 2 (two)  times a day.    [provider]  promethazine -dextromethorphan (PROMETHAZINE -DM) 6.25-15 MG/5ML syrup Take 5 mLs by mouth 4 (four) times daily as needed. Patient not taking: Reported on 08/31/2023 07/15/22   Angelia Kelp, PA-C  warfarin (COUMADIN) 10 MG tablet Take 10 mg by mouth every Monday, Wednesday, and Friday.     [provider]  warfarin (COUMADIN) 5 MG tablet Take 5 mg by mouth See admin instructions. Take 1 tablet (5mg ) by mouth every Sunday, Tuesday, Thursday and Saturday    [provider]    Physical Exam   Triage Vital Signs: ED Triage Vitals  Encounter Vitals Group     BP 01/06/24 0420 118/73     Systolic BP Percentile --      Diastolic BP Percentile --      Pulse Rate 01/06/24 0420 (!) 110     Resp 01/06/24 0420 19     Temp 01/06/24 0420 99.2 F (37.3 C)     Temp Source 01/06/24 0420 Oral     SpO2 01/06/24 0420 97 %     Weight 01/06/24 0414 160 lb (72.6 kg)     Height 01/06/24 0414 6\' 3"  (1.905 m)     Head Circumference --      Peak Flow --      Pain Score 01/06/24 0414 0     Pain Loc --  Pain Education --      Exclude from Growth Chart --     Most recent vital signs: Vitals:   01/06/24 0420 01/06/24 0500  BP: 118/73 107/62  Pulse: (!) 110 95  Resp: 19 14  Temp: 99.2 F (37.3 C)   SpO2: 97% 96%    CONSTITUTIONAL: Alert, responds appropriately to questions. Well-appearing; well-nourished HEAD: Normocephalic, atraumatic EYES: Conjunctivae clear, pupils appear equal, sclera nonicteric ENT: normal nose; moist mucous membranes NECK: Supple, normal ROM CARD: Regular and tachycardic; S1 and S2 appreciated RESP: Normal chest excursion without splinting or tachypnea; breath sounds clear and equal bilaterally; no wheezes, no rhonchi, no rales, no hypoxia or respiratory distress, speaking full sentences ABD/GI: Non-distended; soft, non-tender, no rebound, no guarding, no peritoneal signs BACK: The back appears normal EXT:  Normal ROM in all joints; no deformity noted, no edema, no calf tenderness or calf swelling SKIN: Normal color for age and race; warm; no rash on exposed skin NEURO: Moves all extremities equally, normal speech PSYCH: The patient's mood and manner are appropriate.   ED Results / Procedures / Treatments   LABS: (all labs ordered are listed, but only abnormal results are displayed) Labs Reviewed  RESP PANEL BY RT-PCR (RSV, FLU A&B, COVID)  RVPGX2 - Abnormal; Notable for the following components:      Result Value   SARS Coronavirus 2 by RT PCR POSITIVE (*)    All other components within normal limits  CBC - Abnormal; Notable for the following components:   HCT 38.1 (*)    All other components within normal limits  BASIC METABOLIC PANEL WITH GFR - Abnormal; Notable for the following components:   Potassium 3.1 (*)    Glucose, Bld 127 (*)    All other components within normal limits  HEPATIC FUNCTION PANEL - Abnormal; Notable for the following components:   Total Protein 6.4 (*)    All other components within normal limits  PROTIME-INR  MAGNESIUM  TSH  LACTIC ACID, PLASMA  PROCALCITONIN  TROPONIN I (HIGH SENSITIVITY)  TROPONIN I (HIGH SENSITIVITY)     EKG:  EKG Interpretation Date/Time:  Thursday Jan 06 2024 04:20:57 EDT Ventricular Rate:  109 PR Interval:  194 QRS Duration:  81 QT Interval:  301 QTC Calculation: 406 R Axis:   78  Text Interpretation: Sinus tachycardia Borderline repolarization abnormality Confirmed by Verneda Golder [phone]) on 01/06/2024 4:57:45 AM         RADIOLOGY: My personal review and interpretation of imaging: CT scan shows no PE, pneumonia.  I have personally reviewed all radiology reports.   CT Angio Chest PE W and/or Wo Contrast Result Date: 01/06/2024 CLINICAL DATA:  Tachycardia.  History of septic emboli. EXAM: CT ANGIOGRAPHY CHEST WITH CONTRAST TECHNIQUE: Multidetector CT imaging of the chest was performed using the standard protocol  during bolus administration of intravenous contrast. Multiplanar CT image reconstructions and MIPs were obtained to evaluate the vascular anatomy. RADIATION DOSE REDUCTION: This exam was performed according to the departmental dose-optimization program which includes automated exposure control, adjustment of the mA and/or kV according to patient size and/or use of iterative reconstruction technique. CONTRAST:  75mL OMNIPAQUE  IOHEXOL  350 MG/ML SOLN COMPARISON:  None Available. FINDINGS: Cardiovascular: The heart size is normal. No substantial pericardial effusion. Status post mitral valve replacement. No CT evidence for acute pulmonary embolus although assessment of lower lobe subsegmental branches is degraded by bolus timing and breathing motion. Mediastinum/Nodes: 15 mm short axis right hilar lymph node evident. No left hilar  lymphadenopathy. No mediastinal lymphadenopathy. The esophagus has normal imaging features. There is no axillary lymphadenopathy. Lungs/Pleura: No suspicious pulmonary nodule or mass. No focal airspace consolidation. No pleural effusion. Dependent atelectasis noted in the lower lungs bilaterally. Upper Abdomen: Visualized portion of the upper abdomen shows no acute findings. Musculoskeletal: No worrisome lytic or sclerotic osseous abnormality. Review of the MIP images confirms the above findings. IMPRESSION: 1. No CT evidence for acute pulmonary embolus although assessment of lower lobe subsegmental branches is degraded by bolus timing and breathing motion. 2. 15 mm short axis right hilar lymph node, nonspecific. Potentially reactive, follow-up CT chest with contrast in 3 months suggested to ensure stability. 3. Dependent atelectasis in the lower lungs bilaterally. Electronically Signed   By: Donnal Fusi M.D.   On: 01/06/2024 06:13     PROCEDURES:  Critical Care performed: No      .1-3 Lead EKG Interpretation  Performed by: Johnnay Pleitez, Clover Dao, DO Authorized by: Keyana Guevara, Clover Dao,  DO     Interpretation: abnormal     ECG rate:  110   ECG rate assessment: tachycardic     Rhythm: sinus tachycardia     Ectopy: none     Conduction: normal       IMPRESSION / MDM / ASSESSMENT AND PLAN / ED COURSE  I reviewed the triage vital signs and the nursing notes.    Patient here with subjective fevers, chills, chest pain.  Prior history of septic emboli, endocarditis.  The patient is on the cardiac monitor to evaluate for evidence of arrhythmia and/or significant heart rate changes.   DIFFERENTIAL DIAGNOSIS (includes but not limited to):   Endocarditis, bacteremia, pericarditis, myocarditis, ACS, PE, septic emboli, pneumonia, viral URI, anemia, electrolyte derangement, thyroid dysfunction   Patient's presentation is most consistent with acute presentation with potential threat to life or bodily function.   PLAN: Will obtain labs, cultures, CTA of the chest.  Patient denies anything for pain.  Will give IV fluids.  He is tachycardic but otherwise hemodynamically stable.  Oral temperature is 99.2.   MEDICATIONS GIVEN IN ED: Medications  sodium chloride 0.9 % bolus 500 mL (0 mLs Intravenous Stopped 01/06/24 0550)  iohexol  (OMNIPAQUE ) 350 MG/ML injection 75 mL (75 mLs Intravenous Contrast Given 01/06/24 0550)  potassium chloride SA (KLOR-CON M) CR tablet 40 mEq (40 mEq Oral Given 01/06/24 0618)  ibuprofen (ADVIL) tablet 800 mg (800 mg Oral Given 01/06/24 0618)     ED COURSE: EKG shows sinus tachycardia without ischemia.  No leukocytosis.  Normal hemoglobin.  Potassium of 3.1.  Will give replacement.  Normal magnesium.  Normal TSH.  Lactic normal.   6:30 AM  Pt's CT scan reviewed and interpreted by myself and the radiologist.  No PE, infiltrate.  Does have 1 slightly enlarged right hilar lymph node likely reactive.  Will have him follow-up with his PCP to ensure resolution.  Patient is positive for COVID-19 which is likely the cause of his symptoms.  Will start him on  Paxlovid given he does have significant prior history of medical problems.  He is feeling better, vitals have improved.  First troponin is negative.  Second pending.  If this is negative, anticipate discharge home.   7:04 AM  Pt's second troponin is negative.     Patient's INR is subtherapeutic.  He does have documented history of medical noncompliance with his Coumadin.  Recommended that he continue this medication and follow-up with his cardiologist.    Will discharge home.   At  this time, I do not feel there is any life-threatening condition present. I reviewed all nursing notes, vitals, pertinent previous records.  All lab and urine results, EKGs, imaging ordered have been independently reviewed and interpreted by myself.  I reviewed all available radiology reports from any imaging ordered this visit.  Based on my assessment, I feel the patient is safe to be discharged home without further emergent workup and can continue workup as an outpatient as needed. Discussed all findings, treatment plan as well as usual and customary return precautions.  They verbalize understanding and are comfortable with this plan.  Outpatient follow-up has been provided as needed.  All questions have been answered.   CONSULTS: Admission considered but workup reassuring, patient feeling better.   OUTSIDE RECORDS REVIEWED: Reviewed last cardiology note on 05/11/2023.       FINAL CLINICAL IMPRESSION(S) / ED DIAGNOSES   Final diagnoses:  Chest pain, unspecified type  COVID-19  Subtherapeutic international normalized ratio (INR)     Rx / DC Orders   ED Discharge Orders          Ordered    nirmatrelvir /ritonavir  (PAXLOVID ) 20 x 150 MG & 10 x 100MG  TABS  2 times daily        01/06/24 1914             Note:  This document was prepared using Dragon voice recognition software and may include unintentional dictation errors.   Anayia Eugene, Clover Dao, DO 01/06/24 [id number]    Loreli Debruler, Clover Dao,  DO 01/06/24 [phone]

## 2024-01-06 NOTE — ED Triage Notes (Addendum)
 Pt to triage via w/c, appears very anxious, tearful, rapid speech; reports having tachycardia since 5pm; denies pain; st hx mitral valve replacement; currently taking coumadin but has not had his PT/INR evaluated recently

## 2024-01-06 NOTE — ED Notes (Signed)
 Pt transported to CT at this time.

## 2024-04-12 ENCOUNTER — Ambulatory Visit: Admitting: Urology

## 2024-04-12 VITALS — BP 126/76 | HR 73

## 2024-04-12 DIAGNOSIS — Z3009 Encounter for other general counseling and advice on contraception: Secondary | ICD-10-CM

## 2024-04-12 NOTE — Progress Notes (Signed)
   04/12/24 1:52 PM   Donnice Balloon March 26, 1991 [id number]  CC: vasectomy evaluation   HPI: Jeff Graham is here today for an initial evaluation for a vasectomy.   He has been considering a vasectomy for: >3 years He is married with 3 biologic children. (2nd child passed away in early life). Youngest child is 84 weeks old, healthy. 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 He does not recall holding anticoagulation in recent years for any procedures. Cardiology is Dr. Florencio with [facility]   PMH: Past Medical History:  Diagnosis Date   Mitral valve replaced    Stroke [facility]) 2014    Surgical History: Past Surgical History:  Procedure Laterality Date   CARDIAC SURGERY      Family History: No family history on file.  Social History:  reports that he has never smoked. He has never used smokeless tobacco. He reports that he does not drink alcohol. No history on file for drug use.      Physical Exam: BP 126/76   Pulse 73    Constitutional:  Alert and oriented, No acute distress. Cardiovascular: No clubbing, cyanosis, or edema. Respiratory: Normal respiratory effort, no increased work of breathing. GI: Nondistended GU: easily palpable bilateral vas Skin: No rashes, bruises or suspicious lesions. Neurologic: Grossly intact, no focal deficits, moving all 4 extremities. Psychiatric: Normal mood and affect.  Laboratory Data: N/A   Pertinent Imaging: N/A    Assessment & Plan:    Vasectomy evaluation Assessment & Plan: We discussed the risks and benefits of the vasectomy procedure.  A vasectomy is intended to be a permanent form of contraception, but does not produce immediate sterility.  Following vasectomy, another form of contraception is required until vasal occlusion and azoospermia is confirmed by a post-vasectomy semen analysis, at 2-3  months post-procedure.  Even after vas occlusion is confirmed, vasectomy is not 100% reliable in preventing pregnancy, and the failure rate is approximately 08/1998.  Repeat vasectomy is required in less than 1% of patients.  He should refrain from ejaculation for 1 week after vasectomy.  While the procedure should be recognized as irreversible, options for fertility after vasectomy include vasectomy reversal, and sperm retrieval with in vitro fertilization or ICSI.  These options are not always successful and may be expensive.  Finally, there are other permanent and non-permanent alternatives to vasectomy available. There is no risk of erectile dysfunction, orgasmic disorder, and the volume of semen will be similar to prior, as the majority of the ejaculate is from the prostate and seminal vesicles. We did review procedures-specific complications, including but not limited to: epididymitis, infection, bruising, hematoma, and chronic pain.   The procedure takes ~20 minutes.  I perform a No-Scalpel Vasectomy (NSV) technique with a combination of vasal excision, intraluminal cautery and fascial interposition, which aims to reduce peri-procedural discomfort and bleeding. Lidocaine anesthetic will be used during the procedure. We offer 5-10 mg of Valium 30 minutes prior to the procedure, although this will necessitate a driver-chaperone for discharge.  - request Warfarin bridge / hold peri-procedure. Vasectomy is a low-risk surgery, although high risk of post-op hematoma  - tentatively may schedule office vasectomy, but will need anticoag plan prior to procedure date       Penne Skye, MD 04/12/2024  [facility] [address], [address] [place], [place] [address] [phone]

## 2024-04-12 NOTE — Patient Instructions (Signed)
 Pre-Vasectomy Instructions  STOP all aspirin or blood thinners (Aspirin, Plavix, Coumadin, Warfarin, Motrin, Ibuprofen, Advil, Aleve, Naproxen, Naprosyn) for 7 days prior to the procedure.  If you have any questions about stopping these medications please contact your primary care physician or cardiologist.  Shave all hair from the upper scrotum on the day of the procedure.  This means just under the penis onto the scrotal sac.  The area shaved should measure about 2-3 inches around.  You may lather the scrotum with soap and water, and shave with a safety razor.  After shaving the area, thoroughly wash the penis and the scrotum, then shower or bathe to remove all the loose hairs.  If needed, wash the area again just before coming in for your Vasectomy.  It is recommended to have a light meal an hour or so prior to the procedure.  Bring a scrotal support (jock strap or suspensory, or tight jockey shorts or underwear).  Wear comfortable pants or shorts.  While the actual procedure usually takes about 45 minutes, you should be prepared to stay in the office for approximately one hour.  Bring someone with you to drive you home.  If you have any questions or concerns, please feel free to call the office at [phone].

## 2024-04-12 NOTE — Assessment & Plan Note (Addendum)
 We discussed the risks and benefits of the vasectomy procedure.  A vasectomy is intended to be a permanent form of contraception, but does not produce immediate sterility.  Following vasectomy, another form of contraception is required until vasal occlusion and azoospermia is confirmed by a post-vasectomy semen analysis, at 2-3 months post-procedure.  Even after vas occlusion is confirmed, vasectomy is not 100% reliable in preventing pregnancy, and the failure rate is approximately 08/1998.  Repeat vasectomy is required in less than 1% of patients.  He should refrain from ejaculation for 1 week after vasectomy.  While the procedure should be recognized as irreversible, options for fertility after vasectomy include vasectomy reversal, and sperm retrieval with in vitro fertilization or ICSI.  These options are not always successful and may be expensive.  Finally, there are other permanent and non-permanent alternatives to vasectomy available. There is no risk of erectile dysfunction, orgasmic disorder, and the volume of semen will be similar to prior, as the majority of the ejaculate is from the prostate and seminal vesicles. We did review procedures-specific complications, including but not limited to: epididymitis, infection, bruising, hematoma, and chronic pain.   The procedure takes ~20 minutes.  I perform a No-Scalpel Vasectomy (NSV) technique with a combination of vasal excision, intraluminal cautery and fascial interposition, which aims to reduce peri-procedural discomfort and bleeding. Lidocaine anesthetic will be used during the procedure. We offer 5-10 mg of Valium 30 minutes prior to the procedure, although this will necessitate a driver-chaperone for discharge.  - request Warfarin bridge / hold peri-procedure. Vasectomy is a low-risk surgery, although high risk of post-op hematoma  - tentatively may schedule office vasectomy, but will need anticoag plan prior to procedure date

## 2024-05-04 ENCOUNTER — Encounter: Admitting: Urology

## 2024-05-11 NOTE — Progress Notes (Signed)
   05/18/2024 10:32 AM   Jeff Graham July 21, 1991 [id number]  He has been considering a vasectomy for: >3 years He is married with 3 biologic children. (2nd child passed away in early life). Youngest child is 16 weeks old, healthy. Wife's age: 34 Current form of contraception: condoms Hx of scrotal surgeries: No  Hx of scrotal pain: No     Hx of mechanical mitral valve - on chronic Warfarin. He does not recall holding anticoagulation in recent years for any procedures. Cardiology is Dr. Florencio with [facility]  Vasectomy Procedure Note:  After informed consent and discussion of the procedure and its risks, Jeff Graham was positioned and prepped in the standard fashion.   Anesthesia: 20cc of 1 % lidocaine Suture material: 4-0 chromic Estimated blood loss: <1 ml   After adequate local anesthesia was obtained, each respective vas deferens was isolated using the no scalpel vasectomy (NSV) technique. A portion of vas was transected from each side. The proximal and distal lumens were cauterized.  The proximal ends were buried via fascial interposition with a single chromic stitch.  The bilateral scrotal skin opening were closed with 4-0 chromic.   Patient tolerated the procedure well.  Complications: None  Assessment and Plan: - Discharge post-vasectomy instructions provided - Submit post-vasectomy semen analysis in 8-12 weeks for sterility confirmation   Jeff Skye, MD 05/11/2024

## 2024-05-18 ENCOUNTER — Ambulatory Visit: Admitting: Urology

## 2024-05-18 ENCOUNTER — Encounter: Payer: Self-pay | Admitting: Urology

## 2024-05-18 VITALS — BP 104/68 | HR 72 | Ht 75.0 in | Wt 175.0 lb

## 2024-05-18 DIAGNOSIS — Z302 Encounter for sterilization: Secondary | ICD-10-CM

## 2024-05-18 DIAGNOSIS — Z3009 Encounter for other general counseling and advice on contraception: Secondary | ICD-10-CM

## 2024-05-18 NOTE — Patient Instructions (Signed)

## 2024-08-17 ENCOUNTER — Other Ambulatory Visit: Payer: Self-pay

## 2024-08-17 DIAGNOSIS — Z3009 Encounter for other general counseling and advice on contraception: Secondary | ICD-10-CM
